# Patient Record
Sex: Female | Born: 1970 | Race: White | Hispanic: No | Marital: Single | Smoking: Current every day smoker
Health system: Southern US, Community
[De-identification: ages and names within clinical notes are randomized; demographics above are authoritative.]

## PROBLEM LIST (undated history)

## (undated) DIAGNOSIS — I10 Essential (primary) hypertension: Secondary | ICD-10-CM

## (undated) DIAGNOSIS — F419 Anxiety disorder, unspecified: Secondary | ICD-10-CM

## (undated) DIAGNOSIS — K589 Irritable bowel syndrome without diarrhea: Secondary | ICD-10-CM

## (undated) HISTORY — PX: ABDOMINAL HYSTERECTOMY: SHX81

## (undated) HISTORY — PX: APPENDECTOMY: SHX54

## (undated) HISTORY — PX: HERNIA REPAIR: SHX51

---

## 2015-08-17 ENCOUNTER — Emergency Department (HOSPITAL_COMMUNITY): Payer: Medicaid Other

## 2015-08-17 ENCOUNTER — Encounter (HOSPITAL_COMMUNITY): Payer: Self-pay | Admitting: Emergency Medicine

## 2015-08-17 ENCOUNTER — Emergency Department (HOSPITAL_COMMUNITY)
Admission: EM | Admit: 2015-08-17 | Discharge: 2015-08-17 | Disposition: A | Discharge status: Home / Self Care | Payer: Medicaid Other | Attending: Physician Assistant | Admitting: Physician Assistant

## 2015-08-17 DIAGNOSIS — S63502A Unspecified sprain of left wrist, initial encounter: Secondary | ICD-10-CM | POA: Diagnosis not present

## 2015-08-17 DIAGNOSIS — F17200 Nicotine dependence, unspecified, uncomplicated: Secondary | ICD-10-CM | POA: Diagnosis not present

## 2015-08-17 DIAGNOSIS — S6992XA Unspecified injury of left wrist, hand and finger(s), initial encounter: Secondary | ICD-10-CM | POA: Diagnosis present

## 2015-08-17 DIAGNOSIS — Y92009 Unspecified place in unspecified non-institutional (private) residence as the place of occurrence of the external cause: Secondary | ICD-10-CM | POA: Diagnosis not present

## 2015-08-17 DIAGNOSIS — Z8659 Personal history of other mental and behavioral disorders: Secondary | ICD-10-CM | POA: Insufficient documentation

## 2015-08-17 DIAGNOSIS — I10 Essential (primary) hypertension: Secondary | ICD-10-CM | POA: Diagnosis not present

## 2015-08-17 DIAGNOSIS — W010XXA Fall on same level from slipping, tripping and stumbling without subsequent striking against object, initial encounter: Secondary | ICD-10-CM | POA: Insufficient documentation

## 2015-08-17 DIAGNOSIS — Y998 Other external cause status: Secondary | ICD-10-CM | POA: Diagnosis not present

## 2015-08-17 DIAGNOSIS — Y9301 Activity, walking, marching and hiking: Secondary | ICD-10-CM | POA: Insufficient documentation

## 2015-08-17 DIAGNOSIS — Z8719 Personal history of other diseases of the digestive system: Secondary | ICD-10-CM | POA: Diagnosis not present

## 2015-08-17 HISTORY — DX: Essential (primary) hypertension: I10

## 2015-08-17 HISTORY — DX: Irritable bowel syndrome, unspecified: K58.9

## 2015-08-17 HISTORY — DX: Anxiety disorder, unspecified: F41.9

## 2015-08-17 MED ORDER — HYDROCODONE-ACETAMINOPHEN 5-325 MG PO TABS: 2.0000 | ORAL_TABLET | ORAL | Status: AC | PRN

## 2015-08-17 NOTE — ED Notes (Signed)
Per pt, states she tripped and tried to brace herself falling on left wrist-pain

## 2015-08-17 NOTE — Discharge Instructions (Signed)
Wrist Sprain A wrist sprain is a stretch or tear in the strong, fibrous tissues (ligaments) that connect your wrist bones. The ligaments of your wrist may be easily sprained. There are three types of wrist sprains.  Grade 1. The ligament is not stretched or torn, but the sprain causes pain.  Grade 2. The ligament is stretched or partially torn. You may be able to move your wrist, but not very much.  Grade 3. The ligament or muscle completely tears. You may find it difficult or extremely painful to move your wrist even a little. CAUSES Often, wrist sprains are a result of a fall or an injury. The force of the impact causes the fibers of your ligament to stretch too much or tear. Common causes of wrist sprains include:  Overextending your wrist while catching a ball with your hands.  Repetitive or strenuous extension or bending of your wrist.  Landing on your hand during a fall. RISK FACTORS  Having previous wrist injuries.  Playing contact sports, such as boxing or wrestling.  Participating in activities in which falling is common.  Having poor wrist strength and flexibility. SIGNS AND SYMPTOMS  Wrist pain.  Wrist tenderness.  Inflammation or bruising of the wrist area.  Hearing a "pop" or feeling a tear at the time of the injury.  Decreased wrist movement due to pain, stiffness, or weakness. DIAGNOSIS Your health care provider will examine your wrist. In some cases, an X-ray will be taken to make sure you did not break any bones. If your health care provider thinks that you tore a ligament, he or she may order an MRI of your wrist. TREATMENT Treatment involves resting and icing your wrist. You may also need to take pain medicines to help lessen pain and inflammation. Your health care provider may recommend keeping your wrist still (immobilized) with a splint to help your sprain heal. When the splint is no longer necessary, you may need to perform strengthening and stretching  exercises. These exercises help you to regain strength and full range of motion in your wrist. Surgery is not usually needed for wrist sprains unless the ligament completely tears. HOME CARE INSTRUCTIONS  Rest your wrist. Do not do things that cause pain.  Wear your wrist splint as directed by your health care provider.  Take medicines only as directed by your health care provider.  To ease pain and swelling, apply ice to the injured area.  Put ice in a plastic bag.  Place a towel between your skin and the bag.  Leave the ice on for 20 minutes, 2-3 times a day. SEEK MEDICAL CARE IF:  Your pain, discomfort, or swelling gets worse even with treatment.  You feel sudden numbness in your hand.   This information is not intended to replace advice given to you by your health care provider. Make sure you discuss any questions you have with your health care provider.  Follow up with orthopedic provider for reevaluation. Apply ice to affected area. Take pain medication as needed. Continue taking ibuprofen for inflammation as well. Return to the emergency department if you ask parents worsening of her symptoms, increased urinary wrist, redness or warmth to the touch, fever.

## 2015-08-17 NOTE — ED Notes (Signed)
Called ortho tech for splint 

## 2015-08-17 NOTE — ED Provider Notes (Signed)
CSN: [id number]     Arrival date & time 08/17/15  1136 History  By signing my name below, I, Tanda Rockers, attest that this documentation has been prepared under the direction and in the presence of [employer], PA-C. Electronically Signed: Tanda Rockers, ED Scribe. 08/17/2015. 12:47 PM.   Chief Complaint  Patient presents with  . Wrist Injury   The history is provided by the patient. No language interpreter was used.     HPI Comments: Cindy Andrews is a 45 y.o. female who presents to the Emergency Department complaining of sudden onset, constant, left wrist pain s/p ground level fall that occurred early this morning. Pt reports that she was walking around in her dark house when she tripped and fell. No head injury or LOC.She attempted to catch herself and landed onto her wrist, causing the pain. The pain is exacerbated with movement.  Denies numbness, weakness, tingling, or any other associated symptoms.    Past Medical History  Diagnosis Date  . Hypertension   . IBS (irritable bowel syndrome)   . Anxiety    Past Surgical History  Procedure Laterality Date  . Abdominal hysterectomy    . Hernia repair    . Appendectomy     No family history on file. Social History  Substance Use Topics  . Smoking status: Current Every Day Smoker  . Smokeless tobacco: None  . Alcohol Use: Yes   OB History    No data available     Review of Systems  Musculoskeletal: Positive for arthralgias.  Neurological: Negative for weakness and numbness.  All other systems reviewed and are negative.  Allergies  Review of patient's allergies indicates not on file.  Home Medications   Prior to Admission medications   Not on File   BP 156/118 mmHg  Pulse 107  Temp(Src) 98.3 F (36.8 C) (Oral)  Resp 16  SpO2 97%   Physical Exam  Constitutional: She is oriented to person, place, and time. She appears well-developed and well-nourished. No distress.  HENT:  Head: Normocephalic and  atraumatic.  Eyes: Conjunctivae are normal. Right eye exhibits no discharge. Left eye exhibits no discharge. No scleral icterus.  Cardiovascular: Normal rate.   Pulmonary/Chest: Effort normal.  Musculoskeletal:  Significant TTP over distal radius with limited ROM of wrist limited by pain. Pain with flexion and extension. No edema or ecchymosis. No obvious bony deformity. Intact distal pulses.  Neurological: She is alert and oriented to person, place, and time. Coordination normal.  Skin: Skin is warm and dry. No rash noted. She is not diaphoretic. No erythema. No pallor.  Psychiatric: She has a normal mood and affect. Her behavior is normal.  Nursing note and vitals reviewed.   ED Course  Procedures (including critical care time)  DIAGNOSTIC STUDIES: Oxygen Saturation is 97% on RA, normal by my interpretation.    COORDINATION OF CARE: 12:43 PM-Discussed treatment plan which includes follow up with orthopedist with pt at bedside and pt agreed to plan.   Labs Review Labs Reviewed - No data to display  Imaging Review Dg Wrist Complete Left  08/17/2015  CLINICAL DATA:  Larey Seat last night and tried to catch herself with her LEFT hand, moderate to severe LEFT wrist pain and limited range of motion EXAM: LEFT WRIST - COMPLETE 3+ VIEW COMPARISON:  None. FINDINGS: Osseous mineralization normal. Joint spaces preserved. No acute fracture, dislocation, or bone destruction. IMPRESSION: No acute abnormalities. Electronically Signed   By: Ulyses Southward M.D.   On: 08/17/2015  12:29   I have personally reviewed and evaluated these images as part of my medical decision-making.   EKG Interpretation None      MDM   Final diagnoses:  Wrist sprain, left, initial encounter   Patient X-Ray negative for obvious fracture or dislocation. Suspect severe sprain. Patient still with significant pain. Neurovascularly  Intact. Pt advised to follow up with orthopedics. Pain managed in ED. Patient given wrist brace  while in ED, conservative therapy recommended and discussed. Patient will be discharged home & is agreeable with above plan. Returns precautions discussed. Pt appears safe for discharge.   I personally performed the services described in this documentation, which was scribed in my presence. The recorded information has been reviewed and is accurate      Dub MikesSamantha Tripp Gregorio Worley, PA-C 08/19/15 0114  Courteney Lyn Mackuen, MD 08/20/15 [phone]

## 2017-09-30 IMAGING — CR DG WRIST COMPLETE 3+V*L*
4 series · 4 of 4 positions shown · non-contrast
Comparison: None.

CLINICAL DATA: Fell last night and tried to catch herself with her
LEFT hand, moderate to severe LEFT wrist pain and limited range of
motion

EXAM:
LEFT WRIST - COMPLETE 3+ VIEW

[x wrist pa left]
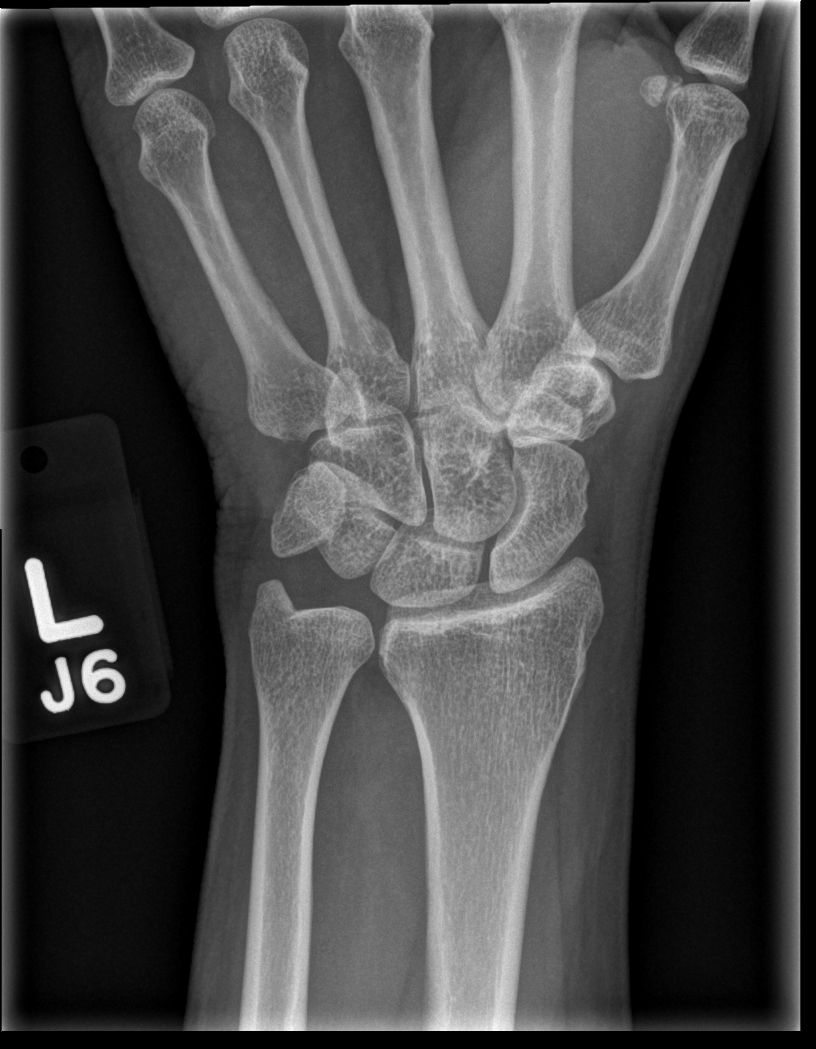

[x wrist obl left]
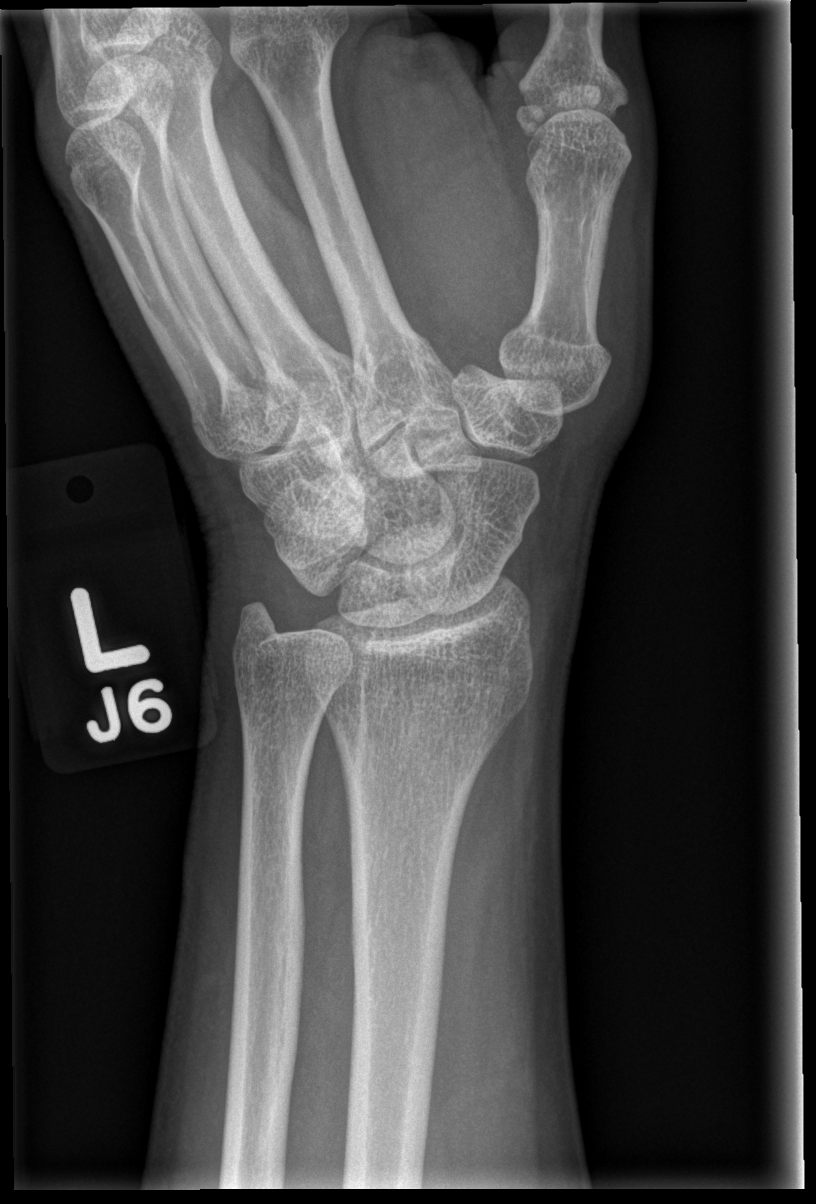

[x wrist lat left]
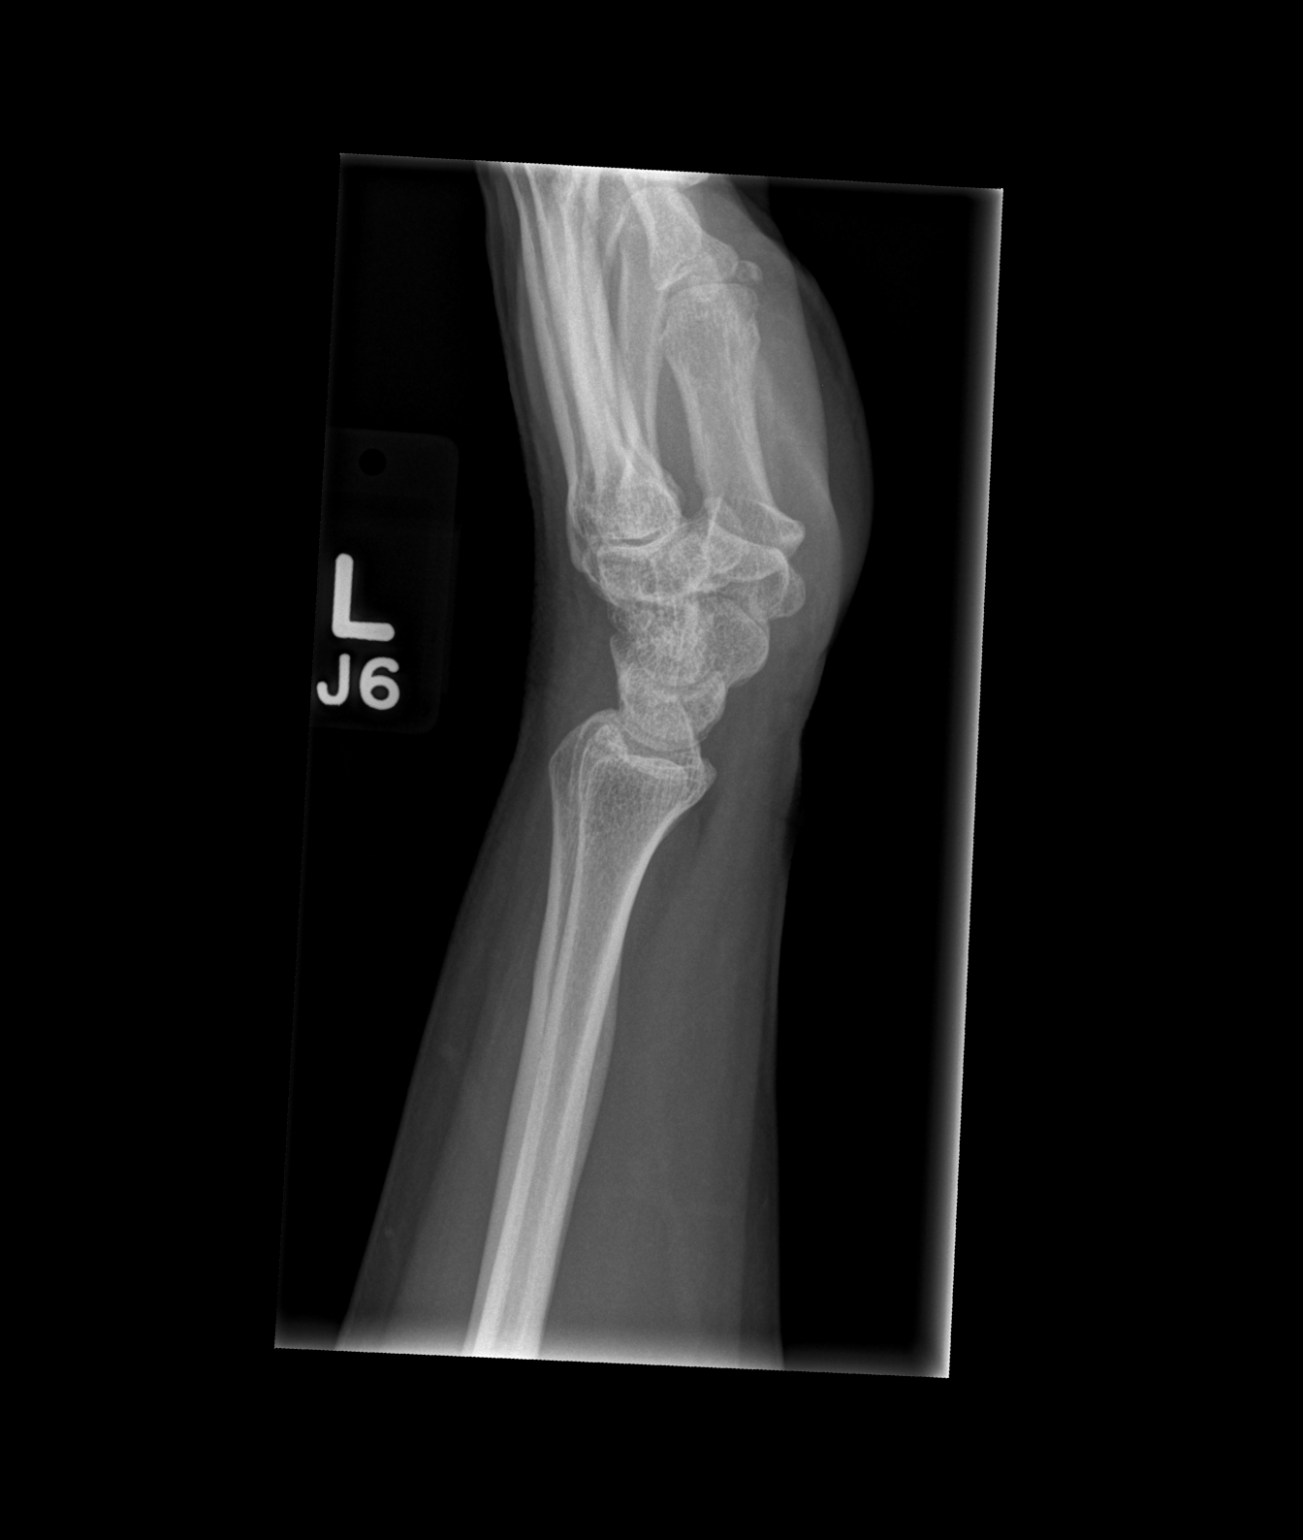

[x wrist navicular view left]
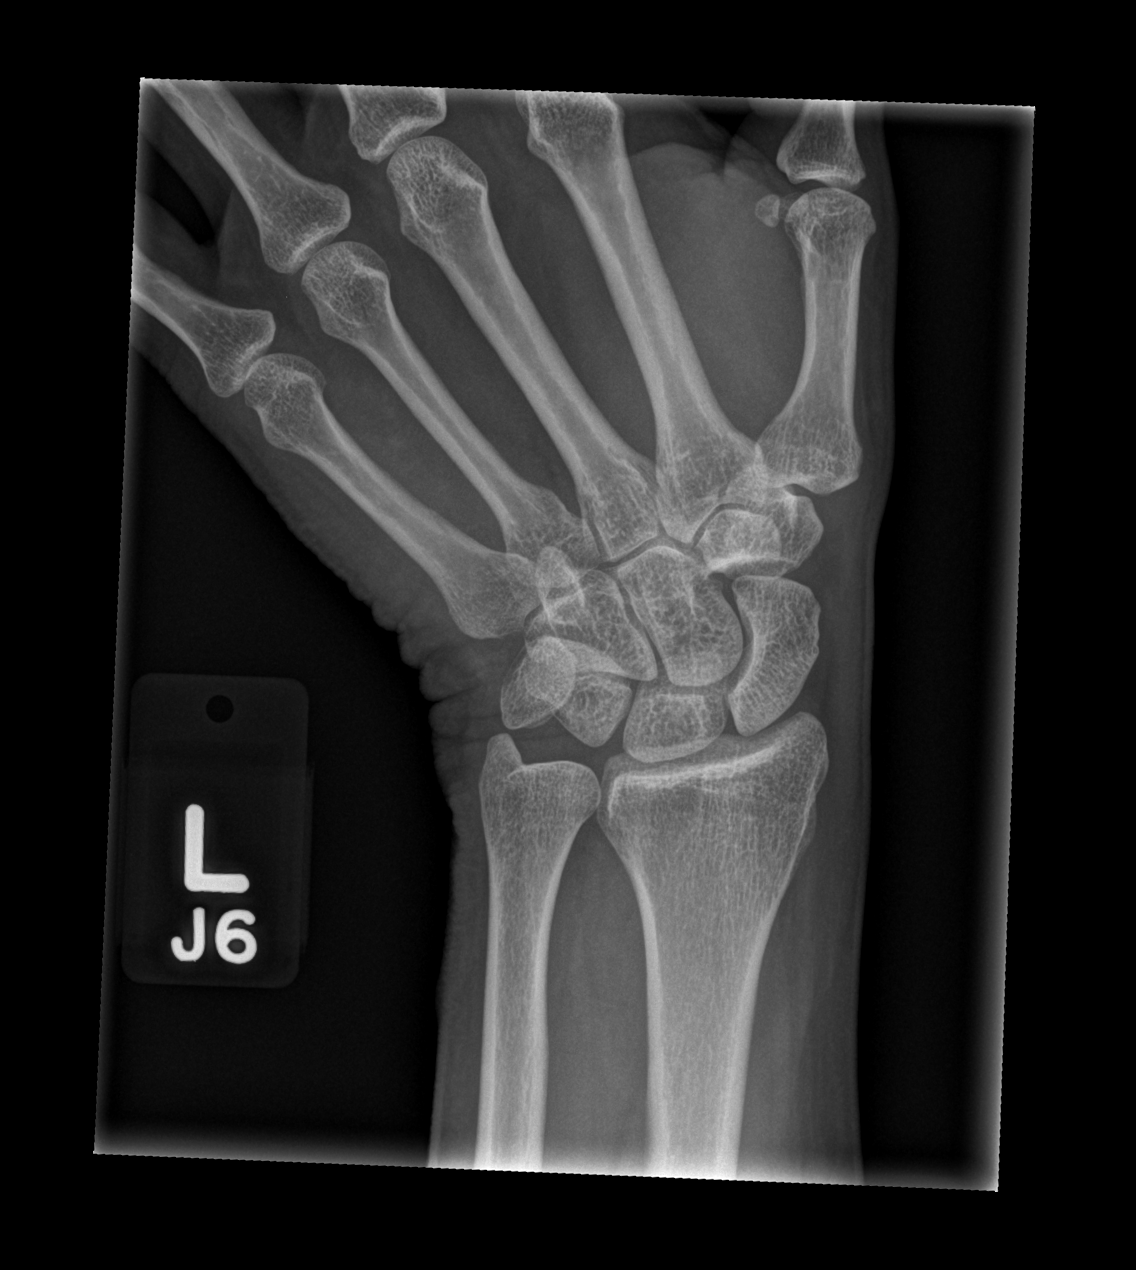

[4 of 4 positions shown; findings below may reference images not displayed]

FINDINGS: Osseous mineralization normal.

Joint spaces preserved.

No acute fracture, dislocation, or bone destruction.
IMPRESSION: No acute abnormalities.
